# Patient Record
Sex: Male | Born: 1990 | Race: White | Hispanic: No | Marital: Single | Smoking: Current every day smoker
Health system: Southern US, Community
[De-identification: ages and names within clinical notes are randomized; demographics above are authoritative.]

---
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
## 2011-03-25 ENCOUNTER — Emergency Department: Payer: Self-pay | Admitting: Emergency Medicine

## 2011-05-09 ENCOUNTER — Emergency Department: Payer: Self-pay | Admitting: Emergency Medicine

## 2011-06-25 ENCOUNTER — Emergency Department: Payer: Self-pay | Admitting: Unknown Physician Specialty

## 2012-03-24 ENCOUNTER — Emergency Department: Payer: Self-pay | Admitting: Emergency Medicine

## 2012-03-24 LAB — URINALYSIS, COMPLETE
Blood: NEGATIVE
Glucose,UR: NEGATIVE mg/dL (ref 0–75)
Leukocyte Esterase: NEGATIVE
Nitrite: NEGATIVE
Ph: 9 (ref 4.5–8.0)
Protein: 30
Specific Gravity: 1.024 (ref 1.003–1.030)

## 2012-03-24 LAB — COMPREHENSIVE METABOLIC PANEL
Albumin: 4.4 g/dL (ref 3.4–5.0)
Alkaline Phosphatase: 88 U/L (ref 50–136)
Bilirubin,Total: 0.7 mg/dL (ref 0.2–1.0)
Co2: 21 mmol/L (ref 21–32)
Creatinine: 1.28 mg/dL (ref 0.60–1.30)
EGFR (African American): >60
EGFR (Non-African Amer.): >60
Glucose: 108 mg/dL — ABNORMAL HIGH (ref 65–99)
Potassium: 3.5 mmol/L (ref 3.5–5.1)
Sodium: 142 mmol/L (ref 136–145)
Total Protein: 8 g/dL (ref 6.4–8.2)

## 2012-03-24 LAB — CBC WITH DIFFERENTIAL/PLATELET
Basophil #: 0 10*3/uL (ref 0.0–0.1)
Eosinophil #: 0.1 10*3/uL (ref 0.0–0.7)
HGB: 15.3 g/dL (ref 13.0–18.0)
Lymphocyte #: 1.2 10*3/uL (ref 1.0–3.6)
Lymphocyte %: 6.4 %
MCH: 31 pg (ref 26.0–34.0)
MCHC: 34.2 g/dL (ref 32.0–36.0)
MCV: 91 fL (ref 80–100)
Monocyte #: 1.6 x10 3/mm — ABNORMAL HIGH (ref 0.2–1.0)
Platelet: 271 10*3/uL (ref 150–440)
RBC: 4.95 10*6/uL (ref 4.40–5.90)
RDW: 12.4 % (ref 11.5–14.5)

## 2012-03-24 LAB — LIPASE, BLOOD: Lipase: 98 U/L (ref 73–393)

## 2012-05-04 ENCOUNTER — Emergency Department: Payer: Self-pay | Admitting: Emergency Medicine

## 2012-07-21 ENCOUNTER — Emergency Department: Payer: Self-pay | Admitting: *Deleted

## 2015-12-02 ENCOUNTER — Emergency Department
Admission: EM | Admit: 2015-12-02 | Discharge: 2015-12-03 | Disposition: A | Discharge status: Home / Self Care | Payer: Self-pay | Attending: Student | Admitting: Student

## 2015-12-02 DIAGNOSIS — Z202 Contact with and (suspected) exposure to infections with a predominantly sexual mode of transmission: Secondary | ICD-10-CM | POA: Insufficient documentation

## 2015-12-02 LAB — URINALYSIS COMPLETE WITH MICROSCOPIC (ARMC ONLY)
Bacteria, UA: NONE SEEN
Bilirubin Urine: NEGATIVE
GLUCOSE, UA: NEGATIVE mg/dL
Hgb urine dipstick: NEGATIVE
Ketones, ur: NEGATIVE mg/dL
Leukocytes, UA: NEGATIVE
NITRITE: NEGATIVE
Protein, ur: NEGATIVE mg/dL
SPECIFIC GRAVITY, URINE: 1.013 (ref 1.005–1.030)
Squamous Epithelial / LPF: NONE SEEN
pH: 6 (ref 5.0–8.0)

## 2015-12-02 MED ORDER — LIDOCAINE HCL (PF) 1 % IJ SOLN
2.1000 mL | Freq: Once | INTRAMUSCULAR | Status: AC
  Administered 2015-12-03: 2.1 mL
  Filled 2015-12-02: qty 5

## 2015-12-02 MED ORDER — CEFTRIAXONE SODIUM 1 G IJ SOLR
500.0000 mg | Freq: Once | INTRAMUSCULAR | Status: AC
  Administered 2015-12-03: 500 mg via INTRAMUSCULAR
  Filled 2015-12-02: qty 10

## 2015-12-02 MED ORDER — AZITHROMYCIN 500 MG PO TABS
1000.0000 mg | ORAL_TABLET | Freq: Every day | ORAL | Status: DC
  Administered 2015-12-03: 1000 mg via ORAL

## 2015-12-02 NOTE — Discharge Instructions (Signed)
No intercourse for the next 7 days. Follow up with the health department for any other STD concerns.

## 2015-12-02 NOTE — ED Notes (Signed)
Pt reports that he was told by his ex girlfriend that she was sleeping around while they were together and she tested positive for chlamydia. Pt has no symptoms.

## 2015-12-03 LAB — CHLAMYDIA/NGC RT PCR (ARMC ONLY)
Chlamydia Tr: NOT DETECTED
N GONORRHOEAE: NOT DETECTED

## 2015-12-03 MED ORDER — AZITHROMYCIN 250 MG PO TABS
ORAL_TABLET | ORAL | Status: AC
  Administered 2015-12-03: 1000 mg via ORAL
  Filled 2015-12-03: qty 4

## 2015-12-03 NOTE — ED Provider Notes (Signed)
[facility] Provider Note  ____________________________________________  Time seen: Approximately 11:25 PM  I have reviewed the triage vital signs and the nursing notes.   HISTORY  Chief Complaint SEXUALLY TRANSMITTED DISEASE    HPI Dillon Morris is a 25 y.o. male who presents to the emergency department for testing and treatment of chlamydia. His partner recently tested positive and they have since had intercourse. He is asymptomatic at this time, however requests treatment.  No past medical history on file.  There are no active problems to display for this patient.   No past surgical history on file.  No current outpatient prescriptions on file.  Allergies Review of patient's allergies indicates no known allergies.  No family history on file.  Social History Social History  Substance Use Topics  . Smoking status: Not on file  . Smokeless tobacco: Not on file  . Alcohol Use: Not on file    Review of Systems Constitutional: No fever/chills Eyes: No visual changes. ENT: No sore throat. Gastrointestinal: No abdominal pain.  No nausea, no vomiting.   Genitourinary: Negative for dysuria. Musculoskeletal: Negative for back pain. ____________________________________________   PHYSICAL EXAM:  VITAL SIGNS: ED Triage Vitals  Enc Vitals Group     BP 12/02/15 2313 116/76 mmHg     Pulse Rate 12/02/15 2313 83     Resp 12/02/15 2313 18     Temp 12/02/15 2313 98.5 F (36.9 C)     Temp Source 12/02/15 2313 Oral     SpO2 12/02/15 2313 97 %     Weight 12/02/15 2313 125 lb (56.7 kg)     Height 12/02/15 2313  (1.676 m)     Head Cir --      Peak Flow --      Pain Score 12/02/15 2327 8     Pain Loc --      Pain Edu? --      Excl. in GC? --     Constitutional: Alert and oriented. Well appearing and in no acute distress. Mouth/Throat: Mucous membranes are moist.  Oropharynx non-erythematous. Neck: No stridor.    Respiratory: Normal respiratory effort.  Gastrointestinal: Soft and nontender. No distention. Genitourinary: Exam deferred  Musculoskeletal: No lower extremity tenderness nor edema. Neurologic:  Normal speech and language. No gross focal neurologic deficits are appreciated. No gait instability. Psychiatric: Mood and affect are normal. Speech and behavior are normal.  ____________________________________________   LABS (all labs ordered are listed, but only abnormal results are displayed)  Labs Reviewed  URINALYSIS COMPLETEWITH MICROSCOPIC ([facility] ONLY) - Abnormal; Notable for the following:    Color, Urine YELLOW (*)    APPearance CLEAR (*)    All other components within normal limits  CHLAMYDIA/NGC RT PCR ([facility] ONLY)   ____________________________________________  EKG   ____________________________________________  RADIOLOGY   ____________________________________________   PROCEDURES  Procedure(s) performed: None  Critical Care performed: No  ____________________________________________   INITIAL IMPRESSION / ASSESSMENT AND PLAN / ED COURSE  Pertinent labs & imaging results that were available during my care of the patient were reviewed by me and considered in my medical decision making (see chart for details).  Patient was given IM Rocephin and 1 g of azithromycin Criselda Peaches the emergency department. His urine was sent for STD testing. He is advised that in the future if he feels that he has been exposed to any type STD that he should go to the health department for testing and treatment. He was advised that  he may return to the emergency department however for concerns if he is unable to schedule an appointment. ____________________________________________   FINAL CLINICAL IMPRESSION(S) / ED DIAGNOSES  Final diagnoses:  Exposure to chlamydia      Chinita Pesterari B Glendel Jaggers, FNP 12/03/15 [id number]  Gayla DossEryka A Gayle, MD 12/03/15 0041

## 2015-12-03 NOTE — ED Notes (Signed)
Discharge instructions reviewed with patient. Patient verbalized understanding. Patient ambulated to lobby without difficulty.   

## 2018-02-16 ENCOUNTER — Emergency Department
Admission: EM | Admit: 2018-02-16 | Discharge: 2018-02-16 | Disposition: A | Discharge status: Home / Self Care | Payer: Self-pay | Attending: Emergency Medicine | Admitting: Emergency Medicine

## 2018-02-16 ENCOUNTER — Encounter: Payer: Self-pay | Admitting: Medical Oncology

## 2018-02-16 DIAGNOSIS — K047 Periapical abscess without sinus: Secondary | ICD-10-CM | POA: Insufficient documentation

## 2018-02-16 MED ORDER — AMOXICILLIN 500 MG PO CAPS: 500.0000 mg | ORAL_CAPSULE | Freq: Three times a day (TID) | ORAL | 0 refills | Status: DC

## 2018-02-16 MED ORDER — IBUPROFEN 600 MG PO TABS: 600.0000 mg | ORAL_TABLET | Freq: Three times a day (TID) | ORAL | 0 refills | Status: DC | PRN

## 2018-02-16 NOTE — Discharge Instructions (Signed)
Follow-up with 1 of the dental clinics listed on your discharge papers.  Begin taking amoxicillin 500 mg 3 times daily for the next 10 days and ibuprofen 600 mg every 8 hours with food for inflammation and pain.  Decrease smoking as this will help with your dental pain.  In addition to the clinics listed below there is a separate paper giving you information about the walk-in clinic at [facility] and [place].  The times are also written on this paper.  You do not need to have an appointment to be seen at these 2 places. OPTIONS FOR DENTAL FOLLOW UP CARE  [place] Department of Health and Human Services - Local Safety Net Dental Clinics [email or web address]://www.ncdhhs.gov/dph/oralhealth/services/safetynetclinics.htm   [facility] [phone])  [facility] [phone])  [facility] [phone]  [facility] [phone])  [facility] [phone]) This clinic caters to the indigent population and is on a lottery system. Location: [employer], [address], [place] Clinic Hours: Wednesdays from 6pm - 9pm, patients seen by a lottery system. For dates, call or go to [email or web address] Services: Cleanings, fillings and simple extractions. Payment Options: DENTAL WORK IS FREE OF CHARGE. Bring proof of income or support. Best way to get seen: Arrive at 5:15 pm - this is a lottery, NOT first come/first serve, so arriving earlier will not increase your chances of being seen.     [facility] [phone] Select option 1 for emergencies   Location: [facility], [facility], [address], [place] Clinic Hours: No walk-ins accepted - call the day before to schedule an appointment. Check in times are 9:30 am and 1:30 pm. Services: Simple extractions, temporary fillings, pulpectomy/pulp debridement, uncomplicated abscess  drainage. Payment Options: PAYMENT IS DUE AT THE TIME OF SERVICE.  Fee is usually $100-200, additional surgical procedures (e.g. abscess drainage) may be extra. Cash, checks, Visa/MasterCard accepted.  Can file Medicaid if patient is covered for dental - patient should call case worker to check. No discount for [facility] patients. Best way to get seen: MUST call the day before and get onto the schedule. Can usually be seen the next 1-2 days. No walk-ins accepted.     [facility] [phone]   Location: [facility], [address], [place] Clinic Hours: M, W, Th, F 8am or 1:30pm, Tues 9a or 1:30 - first come/first served. Services: Simple extractions, temporary fillings, uncomplicated abscess drainage.  You do not need to be an [place] resident. Payment Options: PAYMENT IS DUE AT THE TIME OF SERVICE. Dental insurance, otherwise sliding scale - bring proof of income or support. Depending on income and treatment needed, cost is usually $50-200. Best way to get seen: Arrive early as it is first come/first served.     [facility] [phone]   Location: [address] Clinic Hours: Mon-Thu 8a-5p Services: Most basic dental services including extractions and fillings. Payment Options: PAYMENT IS DUE AT THE TIME OF SERVICE. Sliding scale, up to 50% off - bring proof if income or support. Medicaid with dental option accepted. Best way to get seen: Call to schedule an appointment, can usually be seen within 2 weeks OR they will try to see walk-ins - show up at 8a or 2p (you may have to wait).     [facility] [phone] [place] RESIDENTS ONLY   Location: [facility],  [address] [place], [place] [address] Clinic Hours: By appointment only. Monday - Thursday 8am-5pm, Friday 8am-12pm Services: Cleanings, fillings, extractions. Payment  Options: PAYMENT IS DUE AT THE TIME OF SERVICE. Cash, Visa or MasterCard. Sliding scale - $30 minimum per service. Best way to get seen: Come in to office, complete packet and make an appointment - need proof of income or support monies for each household member and proof of [facility] residence. Usually takes about a month to get in.     [facility] [phone]   Location: [address], [facility] Clinic Hours: Walk-in Urgent Care Dental Services are offered Monday-Friday mornings only. The numbers of emergencies accepted daily is limited to the number of providers available. Maximum 15 - Mondays, Wednesdays & Thursdays Maximum 10 - Tuesdays & Fridays Services: You do not need to be a [facility] resident to be seen for a dental emergency. Emergencies are defined as pain, swelling, abnormal bleeding, or dental trauma. Walkins will receive x-rays if needed. NOTE: Dental cleaning is not an emergency. Payment Options: PAYMENT IS DUE AT THE TIME OF SERVICE. Minimum co-pay is $40.00 for uninsured patients. Minimum co-pay is $3.00 for Medicaid with dental coverage. Dental Insurance is accepted and must be presented at time of visit. Medicare does not cover dental. Forms of payment: Cash, credit card, checks. Best way to get seen: If not previously registered with the clinic, walk-in dental registration begins at 7:15 am and is on a first come/first serve basis. If previously registered with the clinic, call to make an appointment.     [facility] [phone] [place] RESIDENTS ONLY   Location: [address] [place], [place] Clinic Hours: Mon-Thu 10a-2p Services: Extractions only! Payment Options: FREE (donations accepted) - bring proof of income or support Best way to get seen: Call and schedule an appointment OR come at 8am on the 1st Monday of every month (except for holidays) when it is first come/first served.      [facility] [phone]   Location: [address] [place], [place] Clinic Hours: Friday mornings Services, Payment Options, Best way to get seen: Call for info

## 2018-02-16 NOTE — ED Triage Notes (Signed)
Pt reports rt sided dental pain for 1 week.

## 2018-02-16 NOTE — ED Provider Notes (Signed)
[facility] Emergency Department Provider Note  ___________________________________________   First MD Initiated Contact with Patient 02/16/18 [phone]     (approximate)  I have reviewed the triage vital signs and the nursing notes.   HISTORY  Chief Complaint Dental Pain  HPI Dillon Morris is a 27 y.o. male is here with complaint of right sided dental pain for approximately 1 week.  Patient states that he has dental problems both on the right upper and lower areas and also on the left lower jaw.  He states that this has been painful for several months but is worse today.  Rates pain as 10/10.  History reviewed. No pertinent past medical history.  There are no active problems to display for this patient.   History reviewed. No pertinent surgical history.  Prior to Admission medications   Medication Sig Start Date End Date Taking? Authorizing Provider  amoxicillin (AMOXIL) 500 MG capsule Take 1 capsule (500 mg total) by mouth 3 (three) times daily. 02/16/18   Tommi RumpsSummers, Jamesha Ellsworth L, PA-C  ibuprofen (ADVIL,MOTRIN) 600 MG tablet Take 1 tablet (600 mg total) by mouth every 8 (eight) hours as needed. 02/16/18   Tommi RumpsSummers, Miryah Ralls L, PA-C    Allergies Patient has no known allergies.  No family history on file.  Social History Social History   Tobacco Use  . Smoking status: Not on file  Substance Use Topics  . Alcohol use: Not on file  . Drug use: Not on file    Review of Systems Constitutional: No fever/chills Eyes: No visual changes. ENT: No sore throat.  Positive dental pain. Cardiovascular: Denies chest pain. Respiratory: Denies shortness of breath. Gastrointestinal:   No nausea, no vomiting.  Neurological: Negative for headaches ____________________________________________   PHYSICAL EXAM:  VITAL SIGNS: ED Triage Vitals  Enc Vitals Group     BP 02/16/18 0905 131/89     Pulse Rate 02/16/18 0905 89     Resp 02/16/18 0905 18     Temp 02/16/18 0905  98.2 F (36.8 C)     Temp Source 02/16/18 0905 Oral     SpO2 02/16/18 0905 99 %     Weight 02/16/18 0902 125 lb (56.7 kg)     Height 02/16/18 0902 5\' 6"  (1.676 m)     Head Circumference --      Peak Flow --      Pain Score 02/16/18 0902 10     Pain Loc --      Pain Edu? --      Excl. in GC? --    Constitutional: Alert and oriented. Well appearing and in no acute distress. Eyes: Conjunctivae are normal.  Head: Atraumatic. Nose: No congestion/rhinnorhea. Mouth/Throat: Mucous membranes are moist.  Oropharynx non-erythematous.  Multiple dental caries and teeth are in general poor repair and hygiene.  No no obvious abscess seen and no drainage present.  Gums are moderately tender to palpation.  There are teeth with enamel eroded on both the upper and lower right side involving mostly premolars.  There is 1 large carry noted on the left lower premolar. Neck: No stridor.   Hematological/Lymphatic/Immunilogical: No cervical lymphadenopathy. Cardiovascular: Normal rate, regular rhythm. Grossly normal heart sounds.  Good peripheral circulation. Respiratory: Normal respiratory effort.  No retractions. Lungs CTAB. Musculoskeletal: Moves upper and lower extremities without any difficulty.  Normal gait was noted. Neurologic:  Normal speech and language. No gross focal neurologic deficits are appreciated.  Skin:  Skin is warm, dry and intact.  Psychiatric: Mood  and affect are normal. Speech and behavior are normal.  ____________________________________________   LABS (all labs ordered are listed, but only abnormal results are displayed)  Labs Reviewed - No data to display   PROCEDURES  Procedure(s) performed: None  Procedures  Critical Care performed: No  ____________________________________________   INITIAL IMPRESSION / ASSESSMENT AND PLAN / ED COURSE  As part of my medical decision making, I reviewed the following data within the electronic MEDICAL RECORD NUMBER Notes from prior ED  visits and [place] Controlled Substance Database  Patient was given a list of dental clinics in the area including the walk-in clinic at [facility] and [place].  Patient was given a prescription for amoxicillin 500 mg 3 times daily for 10 days and ibuprofen for inflammation and pain.  He is encouraged to call and make an appointment or go to the walk-in clinic for further treatment.  ____________________________________________   FINAL CLINICAL IMPRESSION(S) / ED DIAGNOSES  Final diagnoses:  Dental abscess     ED Discharge Orders        Ordered    amoxicillin (AMOXIL) 500 MG capsule  3 times daily     02/16/18 0949    ibuprofen (ADVIL,MOTRIN) 600 MG tablet  Every 8 hours PRN     02/16/18 0949       Note:  This document was prepared using Dragon voice recognition software and may include unintentional dictation errors.    Tommi Rumps, PA-C 02/16/18 1450    Emily Filbert, MD 02/16/18 [phone]

## 2018-02-16 NOTE — ED Notes (Signed)
See triage note  Presents with dental pain and gum swelling for about 1 week

## 2019-06-13 ENCOUNTER — Emergency Department
Admission: EM | Admit: 2019-06-13 | Discharge: 2019-06-13 | Disposition: A | Discharge status: Home / Self Care | Payer: Self-pay | Attending: Emergency Medicine | Admitting: Emergency Medicine

## 2019-06-13 ENCOUNTER — Other Ambulatory Visit: Payer: Self-pay

## 2019-06-13 ENCOUNTER — Emergency Department: Payer: Self-pay

## 2019-06-13 ENCOUNTER — Encounter: Payer: Self-pay | Admitting: Emergency Medicine

## 2019-06-13 DIAGNOSIS — R112 Nausea with vomiting, unspecified: Secondary | ICD-10-CM | POA: Insufficient documentation

## 2019-06-13 DIAGNOSIS — F17210 Nicotine dependence, cigarettes, uncomplicated: Secondary | ICD-10-CM | POA: Insufficient documentation

## 2019-06-13 DIAGNOSIS — N390 Urinary tract infection, site not specified: Secondary | ICD-10-CM | POA: Insufficient documentation

## 2019-06-13 DIAGNOSIS — R109 Unspecified abdominal pain: Secondary | ICD-10-CM | POA: Insufficient documentation

## 2019-06-13 DIAGNOSIS — R300 Dysuria: Secondary | ICD-10-CM | POA: Insufficient documentation

## 2019-06-13 LAB — COMPREHENSIVE METABOLIC PANEL
ALT: 41 U/L (ref 0–44)
AST: 36 U/L (ref 15–41)
Albumin: 4.7 g/dL (ref 3.5–5.0)
Alkaline Phosphatase: 59 U/L (ref 38–126)
Anion gap: 9 (ref 5–15)
BUN: 16 mg/dL (ref 6–20)
CO2: 23 mmol/L (ref 22–32)
Calcium: 9.7 mg/dL (ref 8.9–10.3)
Chloride: 107 mmol/L (ref 98–111)
Creatinine, Ser: 0.68 mg/dL (ref 0.61–1.24)
GFR calc Af Amer: >60 mL/min (ref >60)
GFR calc non Af Amer: >60 mL/min (ref >60)
Glucose, Bld: 104 mg/dL — ABNORMAL HIGH (ref 70–99)
Potassium: 3.7 mmol/L (ref 3.5–5.1)
Sodium: 139 mmol/L (ref 135–145)
Total Bilirubin: 0.7 mg/dL (ref 0.3–1.2)
Total Protein: 8.1 g/dL (ref 6.5–8.1)

## 2019-06-13 LAB — CBC
HCT: 42.6 % (ref 39.0–52.0)
Hemoglobin: 15 g/dL (ref 13.0–17.0)
MCH: 29.9 pg (ref 26.0–34.0)
MCHC: 35.2 g/dL (ref 30.0–36.0)
MCV: 84.9 fL (ref 80.0–100.0)
Platelets: 323 10*3/uL (ref 150–400)
RBC: 5.02 MIL/uL (ref 4.22–5.81)
RDW: 11.9 % (ref 11.5–15.5)
WBC: 11.2 10*3/uL — ABNORMAL HIGH (ref 4.0–10.5)
nRBC: 0 % (ref 0.0–0.2)

## 2019-06-13 LAB — URINALYSIS, COMPLETE (UACMP) WITH MICROSCOPIC
Bacteria, UA: NONE SEEN
Glucose, UA: NEGATIVE mg/dL
Hgb urine dipstick: NEGATIVE
Ketones, ur: 5 mg/dL — AB
Leukocytes,Ua: NEGATIVE
Nitrite: POSITIVE — AB
Protein, ur: 30 mg/dL — AB
Specific Gravity, Urine: 1.027 (ref 1.005–1.030)
Squamous Epithelial / LPF: NONE SEEN (ref 0–5)
WBC, UA: NONE SEEN WBC/hpf (ref 0–5)
pH: 7 (ref 5.0–8.0)

## 2019-06-13 MED ORDER — KETOROLAC TROMETHAMINE 30 MG/ML IJ SOLN
30.0000 mg | Freq: Once | INTRAMUSCULAR | Status: AC
  Administered 2019-06-13: 30 mg via INTRAVENOUS
  Filled 2019-06-13: qty 1

## 2019-06-13 MED ORDER — MORPHINE SULFATE (PF) 4 MG/ML IV SOLN
4.0000 mg | Freq: Once | INTRAVENOUS | Status: AC
  Administered 2019-06-13: 10:00:00 4 mg via INTRAVENOUS
  Filled 2019-06-13: qty 1

## 2019-06-13 MED ORDER — HYDROCODONE-ACETAMINOPHEN 5-325 MG PO TABS: 1.0000 | ORAL_TABLET | ORAL | 0 refills | Status: AC | PRN

## 2019-06-13 MED ORDER — ONDANSETRON HCL 4 MG/2ML IJ SOLN
4.0000 mg | Freq: Once | INTRAMUSCULAR | Status: AC
  Administered 2019-06-13: 4 mg via INTRAVENOUS
  Filled 2019-06-13: qty 2

## 2019-06-13 MED ORDER — CEPHALEXIN 500 MG PO CAPS: 500.0000 mg | ORAL_CAPSULE | Freq: Three times a day (TID) | ORAL | 0 refills | Status: AC

## 2019-06-13 MED ORDER — SODIUM CHLORIDE 0.9 % IV SOLN
1.0000 g | Freq: Once | INTRAVENOUS | Status: AC
  Administered 2019-06-13: 13:00:00 1 g via INTRAVENOUS
  Filled 2019-06-13: qty 10

## 2019-06-13 MED ORDER — SODIUM CHLORIDE 0.9 % IV BOLUS
1000.0000 mL | Freq: Once | INTRAVENOUS | Status: AC
  Administered 2019-06-13: 10:00:00 1000 mL via INTRAVENOUS

## 2019-06-13 NOTE — ED Notes (Signed)
This RN introduced self to pt. Pt stated he has had bilateral low back pain for two weeks. Pt states pain is 8/10. Pt states he does have a history of kidney stones. Pt states he had N/V last night. Pt in NAD and VSS. Will continue to monitor.

## 2019-06-13 NOTE — ED Notes (Signed)
Pt c/o pain 9/10 at this time. Pt resting in bed with NAD noted at this time. Pt asking if his fiance if able to come back if she drops their child off at home, this RN explained if she found child care she would be able to come back. Pt states understanding. Explained waiting on results of urine. Pt states understanding at this time. VSS and WNL.

## 2019-06-13 NOTE — ED Notes (Signed)
PT states his pain is unrelieved and still 8/10.

## 2019-06-13 NOTE — ED Triage Notes (Signed)
Pt in via POV, reports bilateral flank pain x a few weeks, developing N/V last night.  Also reports dysuria.  Ambulatory to triage.  NAD noted at this time.

## 2019-06-13 NOTE — ED Provider Notes (Signed)
[facility] Emergency Department Provider Note  Time seen: 9:15 AM  I have reviewed the triage vital signs and the nursing notes.   HISTORY  Chief Complaint Flank Pain   HPI Dillon Morris is a 28 y.o. male with a past medical history of kidney stones presents to the emergency department for bilateral mid back pain.  According to the patient over the past week or so he has been experiencing bilateral mid back pain  along with dysuria.  Patient describes his urination is mild burning sensation has not noticed any blood in the urine.  States he has been taking urinary pain relief pills that have turned his urine orange but he continues to have back pain.  Denies any fever denies any shortness of breath.  Positive for nausea vomiting last night.  History reviewed. No pertinent past medical history.  There are no active problems to display for this patient.   History reviewed. No pertinent surgical history.  Prior to Admission medications   Medication Sig Start Date End Date Taking? Authorizing Provider  amoxicillin (AMOXIL) 500 MG capsule Take 1 capsule (500 mg total) by mouth 3 (three) times daily. 02/16/18   Tommi Rumps, PA-C  ibuprofen (ADVIL,MOTRIN) 600 MG tablet Take 1 tablet (600 mg total) by mouth every 8 (eight) hours as needed. 02/16/18   Tommi Rumps, PA-C    No Known Allergies  No family history on file.  Social History Social History   Tobacco Use  . Smoking status: Current Every Day Smoker    Packs/day: 1.00    Types: Cigarettes  . Smokeless tobacco: Never Used  Substance Use Topics  . Alcohol use: Never    Frequency: Never  . Drug use: Never    Review of Systems Constitutional: Negative for fever. Cardiovascular: Negative for chest pain. Respiratory: Negative for shortness of breath.   Gastrointestinal: Bilateral mid back pain.  No abdominal pain.  Nausea vomiting since last night. Genitourinary: Mild burning during  urination. Musculoskeletal: Negative for musculoskeletal complaints Neurological: Negative for headache All other ROS negative  ____________________________________________   PHYSICAL EXAM:  VITAL SIGNS: ED Triage Vitals  Enc Vitals Group     BP 06/13/19 0901 137/88     Pulse Rate 06/13/19 0901 (!) 109     Resp 06/13/19 0901 18     Temp 06/13/19 0901 98.9 F (37.2 C)     Temp Source 06/13/19 0901 Oral     SpO2 06/13/19 0901 98 %     Weight 06/13/19 0859 125 lb (56.7 kg)     Height 06/13/19 0859 5\' 6"  (1.676 m)     Head Circumference --      Peak Flow --      Pain Score 06/13/19 0858 8     Pain Loc --      Pain Edu? --      Excl. in GC? --    Constitutional: Alert and oriented. Well appearing and in no distress. Eyes: Normal exam ENT      Head: Normocephalic and atraumatic      Mouth/Throat: Mucous membranes are moist. Cardiovascular: Normal rate, regular rhythm.  Respiratory: Normal respiratory effort without tachypnea nor retractions. Breath sounds are clear  Gastrointestinal: Soft and nontender. No distention.  Mild bilateral CVA tenderness left somewhat greater than right. Musculoskeletal: Nontender with normal range of motion in all extremities.  Neurologic:  Normal speech and language. No gross focal neurologic deficits  Skin:  Skin is warm, dry and intact.  Psychiatric: Mood and affect are normal.   ____________________________________________   RADIOLOGY  CT shows bilateral nephrolithiasis without ureterolithiasis.  ____________________________________________   INITIAL IMPRESSION / ASSESSMENT AND PLAN / ED COURSE  Pertinent labs & imaging results that were available during my care of the patient were reviewed by me and considered in my medical decision making (see chart for details).  Patient presents to the emergency department for bilateral mid back pain with mild dysuria.  Differential would include ureterolithiasis, cystitis urinary tract infection,  pyelonephritis, musculoskeletal pain.  We will check labs including urinalysis, CT renal scan, treat pain and nausea and IV hydrate while awaiting results.  No ureterolithiasis seen on CT.  We will dose IV Rocephin given a urinalysis that is nitrite positive.  We will send a urine culture.  We will likely discharge on Keflex.  Patient agreeable to plan of care.  [name] was evaluated in Emergency Department on 06/13/2019 for the symptoms described in the history of present illness. He was evaluated in the context of the global COVID-19 pandemic, which necessitated consideration that the patient might be at risk for infection with the SARS-CoV-2 virus that causes COVID-19. Institutional protocols and algorithms that pertain to the evaluation of patients at risk for COVID-19 are in a state of rapid change based on information released by regulatory bodies including the CDC and federal and state organizations. These policies and algorithms were followed during the patient's care in the ED.  ____________________________________________   FINAL CLINICAL IMPRESSION(S) / ED DIAGNOSES  Dysuria Back pain Urinary tract infection   Harvest Dark, MD 06/13/19 1242

## 2019-06-14 LAB — URINE CULTURE: Culture: NO GROWTH

## 2020-02-15 ENCOUNTER — Emergency Department
Admission: EM | Admit: 2020-02-15 | Discharge: 2020-02-15 | Disposition: A | Discharge status: Home / Self Care | Payer: Self-pay | Attending: Emergency Medicine | Admitting: Emergency Medicine

## 2020-02-15 ENCOUNTER — Other Ambulatory Visit: Payer: Self-pay

## 2020-02-15 DIAGNOSIS — Y9389 Activity, other specified: Secondary | ICD-10-CM | POA: Insufficient documentation

## 2020-02-15 DIAGNOSIS — Z79899 Other long term (current) drug therapy: Secondary | ICD-10-CM | POA: Insufficient documentation

## 2020-02-15 DIAGNOSIS — Y929 Unspecified place or not applicable: Secondary | ICD-10-CM | POA: Insufficient documentation

## 2020-02-15 DIAGNOSIS — W228XXA Striking against or struck by other objects, initial encounter: Secondary | ICD-10-CM | POA: Insufficient documentation

## 2020-02-15 DIAGNOSIS — Y999 Unspecified external cause status: Secondary | ICD-10-CM | POA: Insufficient documentation

## 2020-02-15 DIAGNOSIS — F17210 Nicotine dependence, cigarettes, uncomplicated: Secondary | ICD-10-CM | POA: Insufficient documentation

## 2020-02-15 DIAGNOSIS — Z23 Encounter for immunization: Secondary | ICD-10-CM | POA: Insufficient documentation

## 2020-02-15 DIAGNOSIS — S0101XA Laceration without foreign body of scalp, initial encounter: Secondary | ICD-10-CM | POA: Insufficient documentation

## 2020-02-15 MED ORDER — TETANUS-DIPHTH-ACELL PERTUSSIS 5-2.5-18.5 LF-MCG/0.5 IM SUSP
0.5000 mL | Freq: Once | INTRAMUSCULAR | Status: AC
  Administered 2020-02-15: 0.5 mL via INTRAMUSCULAR
  Filled 2020-02-15: qty 0.5

## 2020-02-15 MED ORDER — LIDOCAINE-EPINEPHRINE-TETRACAINE (LET) TOPICAL GEL
3.0000 mL | Freq: Once | TOPICAL | Status: AC
  Administered 2020-02-15: 3 mL via TOPICAL
  Filled 2020-02-15: qty 3

## 2020-02-15 MED ORDER — IBUPROFEN 600 MG PO TABS: 600.0000 mg | ORAL_TABLET | Freq: Four times a day (QID) | ORAL | 0 refills | Status: AC | PRN

## 2020-02-15 NOTE — ED Triage Notes (Signed)
Pt states he accidentally hit himself in the head with posthole diggers and has a lac to the head with controlled bleeding. Denies LOC.Marland Kitchen

## 2020-02-15 NOTE — ED Provider Notes (Signed)
[facility] Emergency Department Provider Note  ____________________________________________  Time seen: Approximately 11:54 AM  I have reviewed the triage vital signs and the nursing notes.   HISTORY  Chief Complaint Laceration    HPI Dillon Morris is a 29 y.o. male that presents to the emergency department for evaluation of head injury.  Patient hit himself in the head with a post hole digger.  He did not lose consciousness.  He has a laceration to the back of his head.  He does not have a headache but feels some throbbing around the laceration.  Unsure of last tetanus.  No vomiting.  History reviewed. No pertinent past medical history.  There are no problems to display for this patient.   History reviewed. No pertinent surgical history.  Prior to Admission medications   Medication Sig Start Date End Date Taking? Authorizing Provider  cephALEXin (KEFLEX) 500 MG capsule Take 1 capsule (500 mg total) by mouth 3 (three) times daily. 06/13/19   Harvest Dark, MD  HYDROcodone-acetaminophen (NORCO/VICODIN) 5-325 MG tablet Take 1 tablet by mouth every 4 (four) hours as needed. 06/13/19   Harvest Dark, MD  ibuprofen (ADVIL) 600 MG tablet Take 1 tablet (600 mg total) by mouth every 6 (six) hours as needed. 02/15/20   Laban Emperor, PA-C    Allergies Patient has no known allergies.  No family history on file.  Social History Social History   Tobacco Use  . Smoking status: Current Every Day Smoker    Packs/day: 1.00    Types: Cigarettes  . Smokeless tobacco: Never Used  Substance Use Topics  . Alcohol use: Never  . Drug use: Never     Review of Systems  Cardiovascular: No chest pain. Respiratory:  No SOB. Gastrointestinal: No nausea, no vomiting.  Musculoskeletal: Negative for musculoskeletal pain. Skin: Negative for rash, abrasions, ecchymosis.  Positive for laceration. Neurological: Negative for numbness or tingling.  Positive for head  injury.   ____________________________________________   PHYSICAL EXAM:  VITAL SIGNS: ED Triage Vitals  Enc Vitals Group     BP 02/15/20 1101 129/80     Pulse Rate 02/15/20 1101 95     Resp 02/15/20 1101 16     Temp 02/15/20 1101 98.3 F (36.8 C)     Temp Source 02/15/20 1101 Oral     SpO2 02/15/20 1101 99 %     Weight 02/15/20 1101 125 lb (56.7 kg)     Height 02/15/20 1101 5\' 6"  (1.676 m)     Head Circumference --      Peak Flow --      Pain Score 02/15/20 1057 4     Pain Loc --      Pain Edu? --      Excl. in [place]? --      Constitutional: Alert and oriented. Well appearing and in no acute distress. Eyes: Conjunctivae are normal. PERRL. EOMI. Head: 2 cm laceration to posterior superior scalp. ENT:      Ears:      Nose: No congestion/rhinnorhea.      Mouth/Throat: Mucous membranes are moist.  Neck: No stridor.  Cardiovascular: Normal rate, regular rhythm.  Good peripheral circulation. Respiratory: Normal respiratory effort without tachypnea or retractions. Lungs CTAB. Good air entry to the bases with no decreased or absent breath sounds. Musculoskeletal: Full range of motion to all extremities. No gross deformities appreciated. Neurologic:  Normal speech and language. No gross focal neurologic deficits are appreciated.  Skin:  Skin is warm, dry  and intact. No rash noted. Psychiatric: Mood and affect are normal. Speech and behavior are normal. Patient exhibits appropriate insight and judgement.   ____________________________________________   LABS (all labs ordered are listed, but only abnormal results are displayed)  Labs Reviewed - No data to display ____________________________________________  EKG   ____________________________________________  RADIOLOGY  No results found.  ____________________________________________    PROCEDURES  Procedure(s) performed:    Procedures  LACERATION REPAIR Performed by: Enid Derry  Consent: Verbal consent  obtained.  Consent given by: patient  Prepped and Draped in normal sterile fashion  Wound explored: No foreign bodies   Laceration Location: head  Laceration Length: 2 cm  Anesthesia: None  Local anesthetic: LET  Amount of cleaning: normal saline  Skin closure: staples  Number of staples: 5  Patient tolerance: Patient tolerated the procedure well with no immediate complications.  Medications  lidocaine-EPINEPHrine-tetracaine (LET) topical gel (3 mLs Topical Given 02/15/20 1218)  Tdap (BOOSTRIX) injection 0.5 mL (0.5 mLs Intramuscular Given 02/15/20 1216)     ____________________________________________   INITIAL IMPRESSION / ASSESSMENT AND PLAN / ED COURSE  Pertinent labs & imaging results that were available during my care of the patient were reviewed by me and considered in my medical decision making (see chart for details).  Review of the [place] CSRS was performed in accordance of the [facility] prior to dispensing any controlled drugs.   Patient presents emergency department for evaluation of head injury.  Vital signs and exam are reassuring.  Patient denies loss of consciousness, nausea, vomiting, headache.  No indication for imaging at this time.  Laceration was repaired with staples.  Tetanus shot was updated.  Patient is to follow up with primary care as directed. Patient is given ED precautions to return to the ED for any worsening or new symptoms.   Dillon Morris was evaluated in Emergency Department on 02/15/2020 for the symptoms described in the history of present illness. He was evaluated in the context of the global COVID-19 pandemic, which necessitated consideration that the patient might be at risk for infection with the SARS-CoV-2 virus that causes COVID-19. Institutional protocols and algorithms that pertain to the evaluation of patients at risk for COVID-19 are in a state of rapid change based on information released by regulatory bodies including the CDC and  federal and state organizations. These policies and algorithms were followed during the patient's care in the ED.  ____________________________________________  FINAL CLINICAL IMPRESSION(S) / ED DIAGNOSES  Final diagnoses:  Laceration of scalp, initial encounter      NEW MEDICATIONS STARTED DURING THIS VISIT:  ED Discharge Orders         Ordered    ibuprofen (ADVIL) 600 MG tablet  Every 6 hours PRN     02/15/20 1324              This chart was dictated using voice recognition software/Dragon. Despite best efforts to proofread, errors can occur which can change the meaning. Any change was purely unintentional.    Enid Derry, PA-C 02/15/20 1402    Dionne Bucy, MD 02/15/20 1616

## 2020-02-25 ENCOUNTER — Emergency Department
Admission: EM | Admit: 2020-02-25 | Discharge: 2020-02-25 | Disposition: A | Discharge status: Home / Self Care | Payer: Self-pay | Attending: Emergency Medicine | Admitting: Emergency Medicine

## 2020-02-25 ENCOUNTER — Other Ambulatory Visit: Payer: Self-pay

## 2020-02-25 ENCOUNTER — Encounter: Payer: Self-pay | Admitting: Emergency Medicine

## 2020-02-25 DIAGNOSIS — F17210 Nicotine dependence, cigarettes, uncomplicated: Secondary | ICD-10-CM | POA: Insufficient documentation

## 2020-02-25 DIAGNOSIS — X58XXXD Exposure to other specified factors, subsequent encounter: Secondary | ICD-10-CM | POA: Insufficient documentation

## 2020-02-25 DIAGNOSIS — S0101XD Laceration without foreign body of scalp, subsequent encounter: Secondary | ICD-10-CM | POA: Insufficient documentation

## 2020-02-25 DIAGNOSIS — Z4802 Encounter for removal of sutures: Secondary | ICD-10-CM

## 2020-02-25 NOTE — ED Triage Notes (Signed)
Suture removal

## 2020-02-25 NOTE — ED Provider Notes (Signed)
[facility] Emergency Department Provider Note  ____________________________________________  Time seen: Approximately 4:47 PM  I have reviewed the triage vital signs and the nursing notes.   HISTORY  Chief Complaint Suture / Staple Removal    HPI Dillon Morris is a 29 y.o. male presents to emergency department for staple removal.  Staples were placed by myself 10 days ago.  Patient denies any concerns or complications.   History reviewed. No pertinent past medical history.  There are no problems to display for this patient.   History reviewed. No pertinent surgical history.  Prior to Admission medications   Medication Sig Start Date End Date Taking? Authorizing Provider  cephALEXin (KEFLEX) 500 MG capsule Take 1 capsule (500 mg total) by mouth 3 (three) times daily. 06/13/19   Harvest Dark, MD  HYDROcodone-acetaminophen (NORCO/VICODIN) 5-325 MG tablet Take 1 tablet by mouth every 4 (four) hours as needed. 06/13/19   Harvest Dark, MD  ibuprofen (ADVIL) 600 MG tablet Take 1 tablet (600 mg total) by mouth every 6 (six) hours as needed. 02/15/20   Laban Emperor, PA-C    Allergies Patient has no known allergies.  No family history on file.  Social History Social History   Tobacco Use  . Smoking status: Current Every Day Smoker    Packs/day: 1.00    Types: Cigarettes  . Smokeless tobacco: Never Used  Vaping Use  . Vaping Use: Never used  Substance Use Topics  . Alcohol use: Never  . Drug use: Never     Review of Systems  Constitutional: No fever/chills Musculoskeletal: Negative for musculoskeletal pain. Skin: Negative for rash, ecchymosis.  Positive for stapled laceration. Neurological: Negative for headaches, numbness or tingling   ____________________________________________   PHYSICAL EXAM:  VITAL SIGNS: ED Triage Vitals  Enc Vitals Group     BP 02/25/20 1229 137/68     Pulse Rate 02/25/20 1229 77     Resp 02/25/20  1229 18     Temp 02/25/20 1229 98.8 F (37.1 C)     Temp Source 02/25/20 1229 Oral     SpO2 02/25/20 1229 100 %     Weight 02/25/20 1217 125 lb (56.7 kg)     Height 02/25/20 1217 5\' 6"  (1.676 m)     Head Circumference --      Peak Flow --      Pain Score 02/25/20 1217 0     Pain Loc --      Pain Edu? --      Excl. in [place]? --      Constitutional: Alert and oriented. Well appearing and in no acute distress. Eyes: Conjunctivae are normal. PERRL. EOMI. Head: Stapled laceration to scalp.  No surrounding erythema.  No drainage. ENT:      Ears:      Nose: No congestion/rhinnorhea.      Mouth/Throat: Mucous membranes are moist.  Neck: No stridor.  Cardiovascular: Normal rate, regular rhythm.  Good peripheral circulation. Respiratory: Normal respiratory effort without tachypnea or retractions. Lungs CTAB. Good air entry to the bases with no decreased or absent breath sounds. Musculoskeletal: Full range of motion to all extremities. No gross deformities appreciated. Neurologic:  Normal speech and language. No gross focal neurologic deficits are appreciated.  Skin:  Skin is warm, dry. Psychiatric: Mood and affect are normal. Speech and behavior are normal. Patient exhibits appropriate insight and judgement.   ____________________________________________   LABS (all labs ordered are listed, but only abnormal results are displayed)  Labs Reviewed -  No data to display ____________________________________________  EKG   ____________________________________________  RADIOLOGY   No results found.  ____________________________________________    PROCEDURES  Procedure(s) performed:    Procedures  SUTURE REMOVAL Performed by: Enid Derry  Consent: Verbal consent obtained. Patient identity confirmed: provided demographic data Time out: Immediately prior to procedure a "time out" was called to verify the correct patient, procedure, equipment, support staff and site/side  marked as required.  Location details: scalp  Wound Appearance: clean  Sutures/Staples Removed: 5  Facility: sutures placed in this facility Patient tolerance: Patient tolerated the procedure well with no immediate complications.    Medications - No data to display   ____________________________________________   INITIAL IMPRESSION / ASSESSMENT AND PLAN / ED COURSE  Pertinent labs & imaging results that were available during my care of the patient were reviewed by me and considered in my medical decision making (see chart for details).  Review of the  CSRS was performed in accordance of the [facility] prior to dispensing any controlled drugs.   Patient presented to emergency department for staple removal.  Vital signs and exam are reassuring.  Patient is to follow up with primary care as directed. Patient is given ED precautions to return to the ED for any worsening or new symptoms.  Dillon Morris was evaluated in Emergency Department on 02/25/2020 for the symptoms described in the history of present illness. He was evaluated in the context of the global COVID-19 pandemic, which necessitated consideration that the patient might be at risk for infection with the SARS-CoV-2 virus that causes COVID-19. Institutional protocols and algorithms that pertain to the evaluation of patients at risk for COVID-19 are in a state of rapid change based on information released by regulatory bodies including the CDC and federal and state organizations. These policies and algorithms were followed during the patient's care in the ED.   ____________________________________________  FINAL CLINICAL IMPRESSION(S) / ED DIAGNOSES  Final diagnoses:  Encounter for staple removal      NEW MEDICATIONS STARTED DURING THIS VISIT:  ED Discharge Orders    None          This chart was dictated using voice recognition software/Dragon. Despite best efforts to proofread, errors can occur which can change  the meaning. Any change was purely unintentional.    Enid Derry, PA-C 02/25/20 1714    Jene Every, MD 03/01/20 [phone]

## 2021-03-20 IMAGING — CT CT RENAL STONE PROTOCOL
3 of 4 series · 8 of 46 positions shown, 15 images · non-contrast
Comparison: March 24, 2012

CLINICAL DATA: Bilateral flank pain with nausea and vomiting

EXAM:
CT ABDOMEN AND PELVIS WITHOUT CONTRAST
TECHNIQUE: Multidetector CT imaging of the abdomen and pelvis was performed
following the standard protocol without oral or IV contrast.

[Series 4: lung bases · axial · 0.62mm/px · z∈[-115,-55]mm · 4 of 20 slices shown, 9 images]
[im 4/20  soft-tissue]
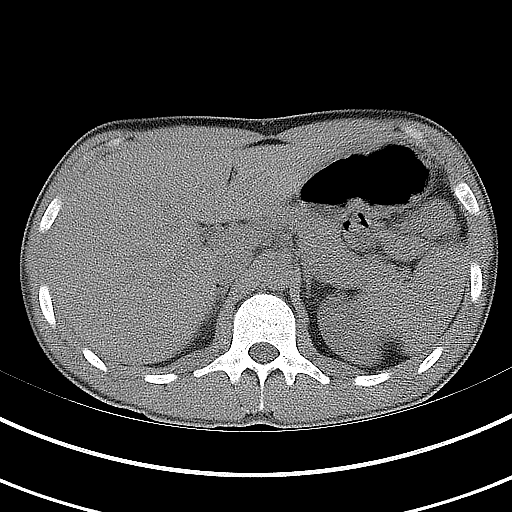
[im 4/20  lung]
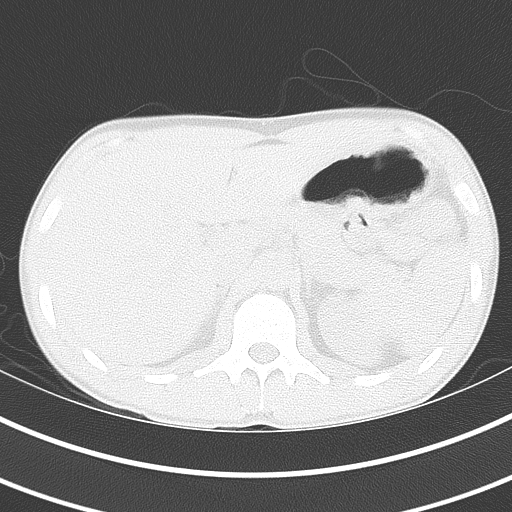
[im 4/20  bone]
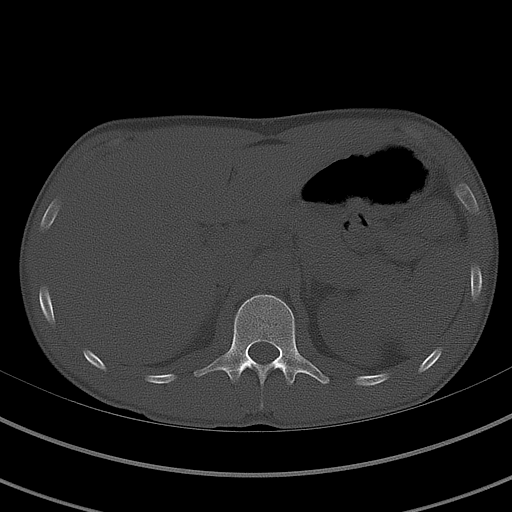
[im 8/20  soft-tissue]
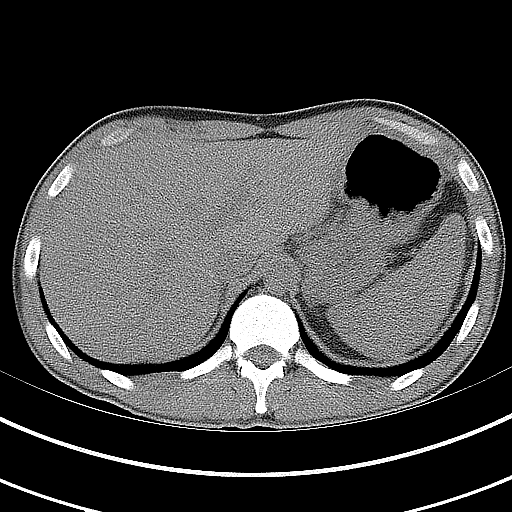
[im 8/20  lung]
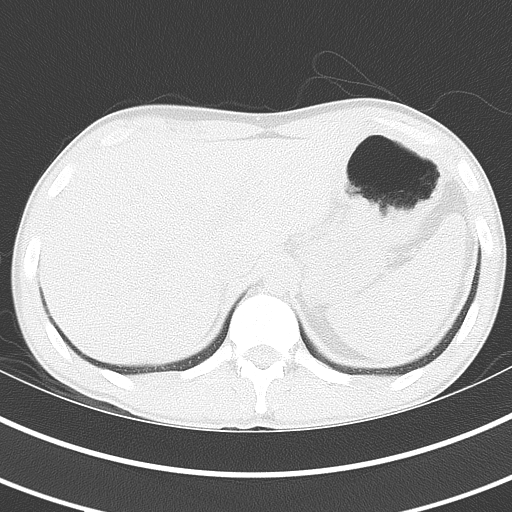
[im 12/20  soft-tissue]
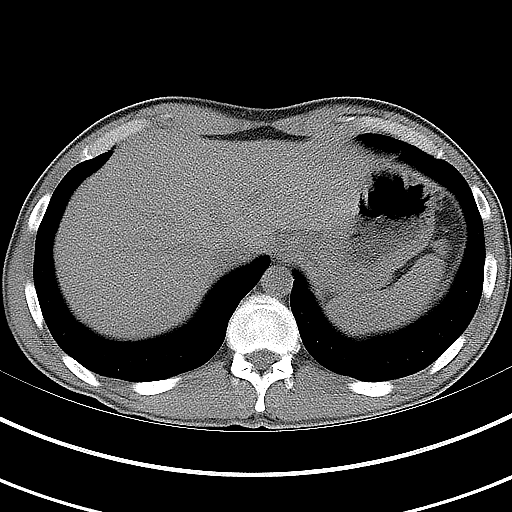
[im 12/20  lung]
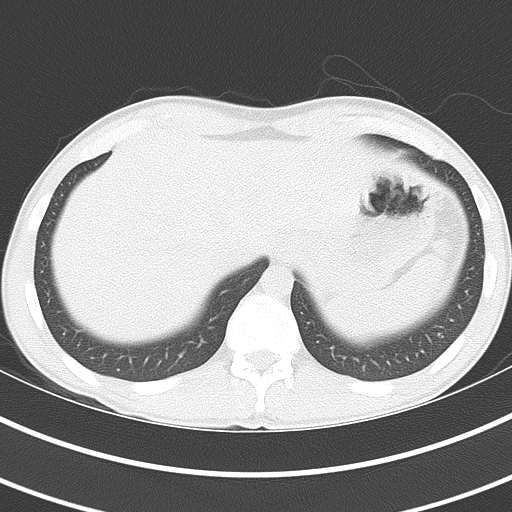
[im 16/20  soft-tissue]
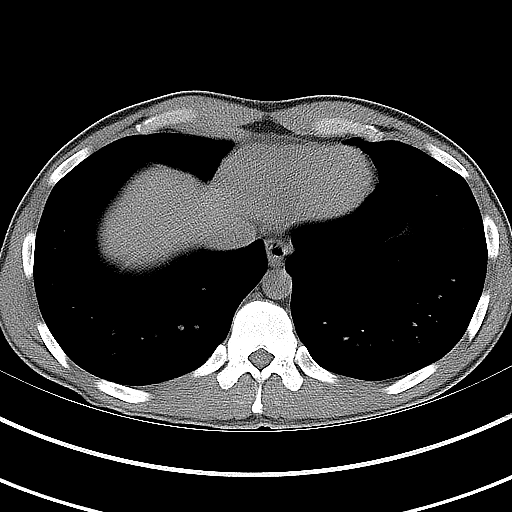
[im 16/20  lung]
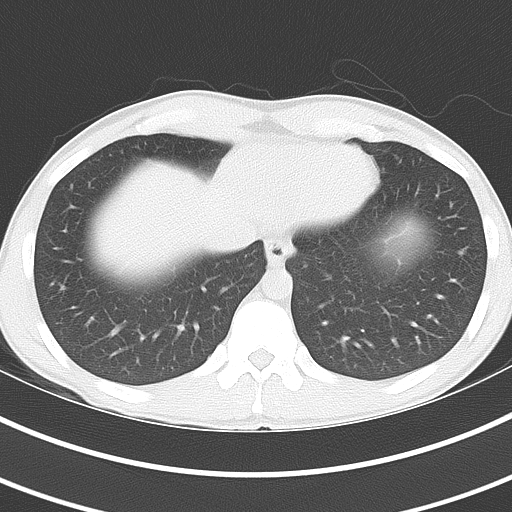

[Series 5: coronal · coronal · 0.67mm/px · 3 of 99 slices shown, 4 images]
[im 33/99  soft-tissue]
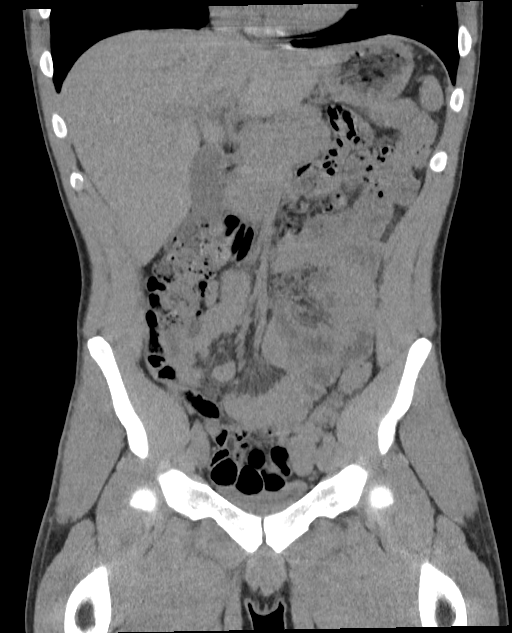
[im 44/99  soft-tissue]
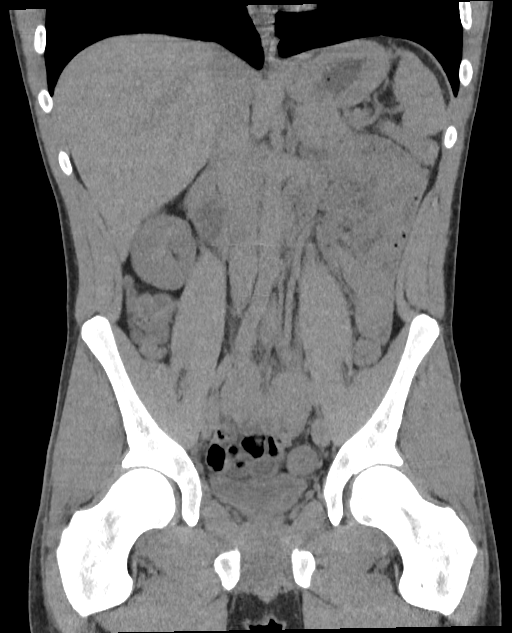
[im 44/99  bone]
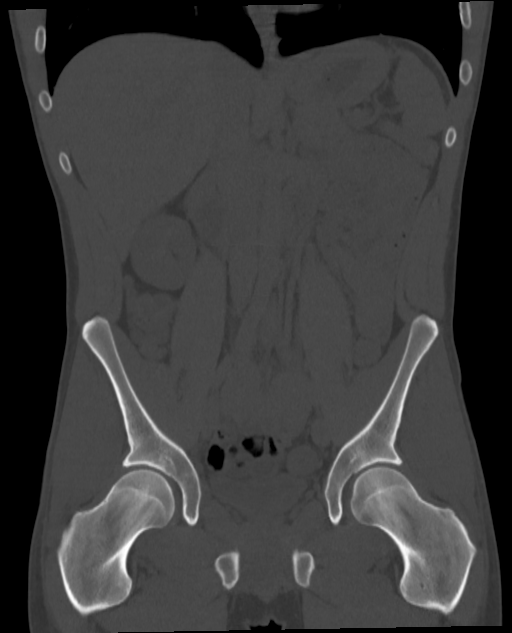
[im 55/99  soft-tissue]
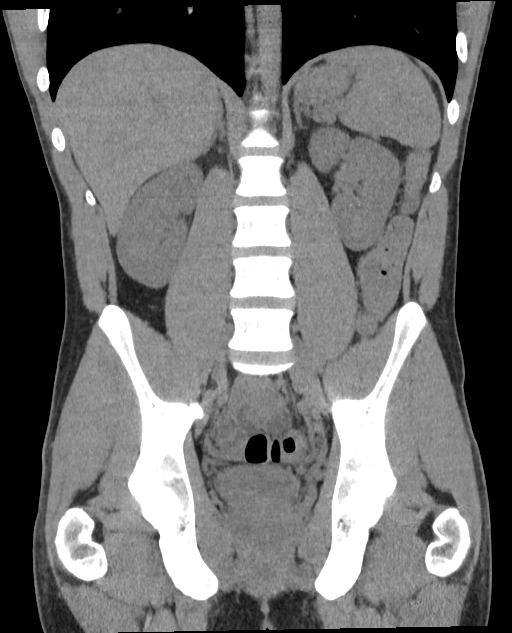

[Series 6: sagittal · sagittal · 0.42mm/px · 1 of 150 slices shown, 2 images]
[im 50/150  soft-tissue]
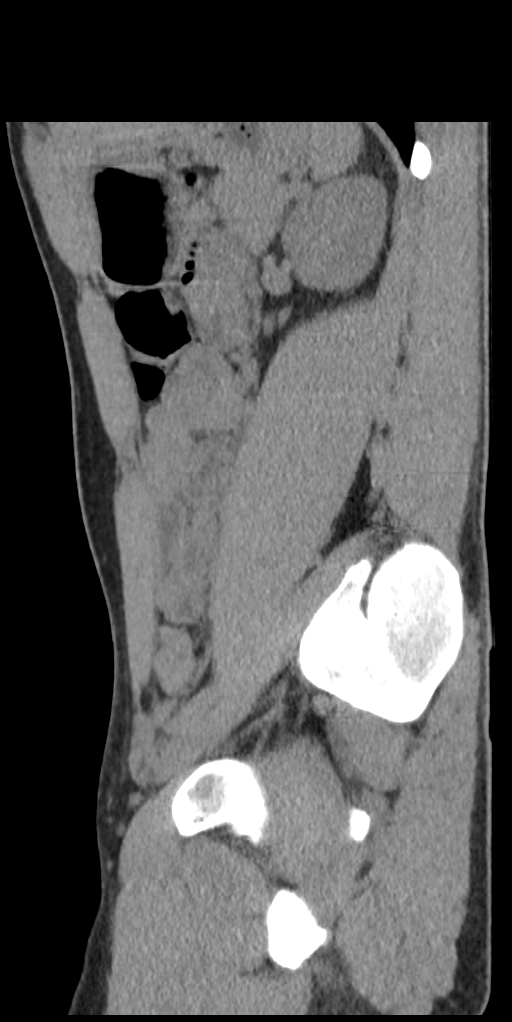
[im 50/150  bone]
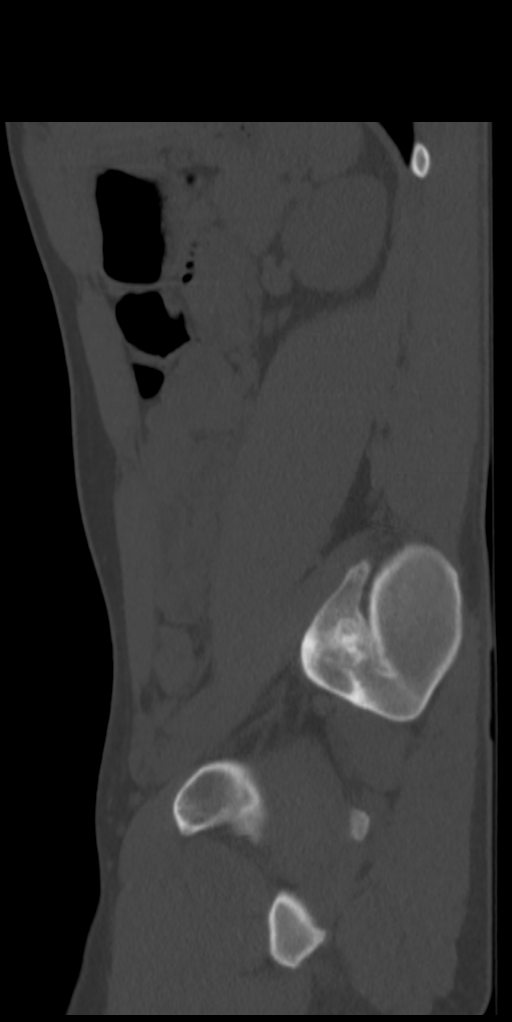

[8 of 46 positions shown; findings below may reference images not displayed]

FINDINGS: Lower chest: Lung bases are clear.

Hepatobiliary: No focal liver lesions are evident on this
noncontrast enhanced study. Gallbladder wall is not appreciably
thickened. There is no biliary duct dilatation.

Pancreas: There is no pancreatic mass or inflammatory focus.

Spleen: No splenic lesions are evident.

Adrenals/Urinary Tract: Adrenals bilaterally appear unremarkable.
There is no appreciable renal mass or hydronephrosis on either side.
There is a 1 mm nonobstructing calculus in the lower pole of the
right kidney. There is a 4 x 3 mm calculus in the upper pole of the
left kidney. There is minimal nephrocalcinosis bilaterally. There is
no appreciable ureteral calculus on either side. Urinary bladder is
midline with wall thickness within normal limits.

Stomach/Bowel: There is no appreciable bowel wall or mesenteric
thickening. There is no evident bowel obstruction. Terminal ileum
appears unremarkable. There is no appreciable free air or portal
venous air.

Vascular/Lymphatic: There is no abdominal aortic aneurysm. No
vascular lesions are evident on this noncontrast enhanced study.
There is no evident adenopathy in the abdomen or pelvis.

Reproductive: Prostate and seminal vesicles are normal in size and
contour. There is no evident pelvic mass.

Other: Appendix appears unremarkable. There is no abscess or ascites
in the abdomen or pelvis.

Musculoskeletal: There are no blastic or lytic bone lesions. There
is no intramuscular or abdominal wall lesion.
IMPRESSION: 1. Nonobstructing calculus in each kidney. Minimal nephrocalcinosis.
No ureteral calculus or hydronephrosis on either side. Urinary
bladder wall thickness is within normal limits.

2. No bowel obstruction. No abscess in the abdomen or pelvis.
Appendix appears normal.
